# Patient Record
Sex: Female | Born: 2000
Health system: Southern US, Community
[De-identification: ages and names within clinical notes are randomized; demographics above are authoritative.]

## PROBLEM LIST (undated history)

## (undated) DIAGNOSIS — G2569 Other tics of organic origin: Secondary | ICD-10-CM

## (undated) DIAGNOSIS — H9190 Unspecified hearing loss, unspecified ear: Secondary | ICD-10-CM

## (undated) HISTORY — DX: Other tics of organic origin: G25.69

## (undated) HISTORY — PX: OSTEOCHONDROMA EXCISION: SHX2137

## (undated) HISTORY — DX: Unspecified hearing loss, unspecified ear: H91.90

---
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
## 2019-02-22 DIAGNOSIS — M25562 Pain in left knee: Secondary | ICD-10-CM | POA: Diagnosis not present

## 2019-02-24 ENCOUNTER — Other Ambulatory Visit: Payer: Self-pay | Admitting: Radiology

## 2019-02-24 ENCOUNTER — Ambulatory Visit
Admission: RE | Admit: 2019-02-24 | Discharge: 2019-02-24 | Disposition: A | Discharge status: Home / Self Care | Payer: BC Managed Care – PPO | Source: Ambulatory Visit | Attending: Radiology | Admitting: Radiology

## 2019-02-24 DIAGNOSIS — D242 Benign neoplasm of left breast: Secondary | ICD-10-CM

## 2019-02-28 DIAGNOSIS — M25562 Pain in left knee: Secondary | ICD-10-CM | POA: Diagnosis not present

## 2019-03-03 DIAGNOSIS — M25562 Pain in left knee: Secondary | ICD-10-CM | POA: Diagnosis not present

## 2019-03-17 DIAGNOSIS — M25562 Pain in left knee: Secondary | ICD-10-CM | POA: Diagnosis not present

## 2019-04-01 DIAGNOSIS — M25562 Pain in left knee: Secondary | ICD-10-CM | POA: Diagnosis not present

## 2019-04-07 DIAGNOSIS — M25562 Pain in left knee: Secondary | ICD-10-CM | POA: Diagnosis not present

## 2019-04-14 DIAGNOSIS — M25562 Pain in left knee: Secondary | ICD-10-CM | POA: Diagnosis not present

## 2019-08-30 ENCOUNTER — Other Ambulatory Visit: Payer: Self-pay

## 2019-08-30 ENCOUNTER — Ambulatory Visit
Admission: RE | Admit: 2019-08-30 | Discharge: 2019-08-30 | Disposition: A | Discharge status: Home / Self Care | Payer: BC Managed Care – PPO | Source: Ambulatory Visit | Attending: Radiology | Admitting: Radiology

## 2019-08-30 DIAGNOSIS — D242 Benign neoplasm of left breast: Secondary | ICD-10-CM

## 2019-08-30 DIAGNOSIS — N6324 Unspecified lump in the left breast, lower inner quadrant: Secondary | ICD-10-CM | POA: Diagnosis not present

## 2020-01-07 ENCOUNTER — Ambulatory Visit: Payer: BC Managed Care – PPO | Attending: Internal Medicine

## 2020-01-07 DIAGNOSIS — Z23 Encounter for immunization: Secondary | ICD-10-CM

## 2020-01-07 NOTE — Progress Notes (Signed)
   Covid-19 Vaccination Clinic  Name:  Shakeila Marchitto    MRN: [id number] DOB: 27-Aug-2001  01/07/2020  Ms. Cardo was observed post Covid-19 immunization for 15 minutes without incident. She was provided with Vaccine Information Sheet and instruction to access the V-Safe system.   Ms. Ylitalo was instructed to call 911 with any severe reactions post vaccine: Marland Kitchen Difficulty breathing  . Swelling of face and throat  . A fast heartbeat  . A bad rash all over body  . Dizziness and weakness   Immunizations Administered    Name Date Dose VIS Date Route   Pfizer COVID-19 Vaccine 01/07/2020  9:26 AM 0.3 mL 10/26/2018 Intramuscular   Manufacturer: [place]   Lot: [id number]   [place]: [id number]

## 2020-01-31 ENCOUNTER — Ambulatory Visit: Payer: BC Managed Care – PPO | Attending: Internal Medicine

## 2020-01-31 DIAGNOSIS — Z23 Encounter for immunization: Secondary | ICD-10-CM

## 2020-01-31 NOTE — Progress Notes (Signed)
   Covid-19 Vaccination Clinic  Name:  Eileen Rivera    MRN: [id number] DOB: Nov 10, 2000  01/31/2020  [name] was observed post Covid-19 immunization for 15 minutes without incident. She was provided with Vaccine Information Sheet and instruction to access the V-Safe system.   [name] was instructed to call 911 with any severe reactions post vaccine: Marland Kitchen Difficulty breathing  . Swelling of face and throat  . A fast heartbeat  . A bad rash all over body  . Dizziness and weakness   Immunizations Administered    Name Date Dose VIS Date Route   Pfizer COVID-19 Vaccine 01/31/2020  4:51 PM 0.3 mL 10/26/2018 Intramuscular   Manufacturer: [employer], [employer]   Lot: [id number]   [place]: [id number]

## 2020-02-09 IMAGING — US ULTRASOUND LEFT BREAST LIMITED
1 series · 5 of 5 positions shown · non-contrast
Comparison: None.

CLINICAL DATA: Palpable left breast mass felt by the patient for
approximately 1 week.

EXAM:
ULTRASOUND OF THE LEFT BREAST

[Series 1: ultrasound left breast limited · 0.06mm/px · 5 of 5 slices shown]
[im 1/5]
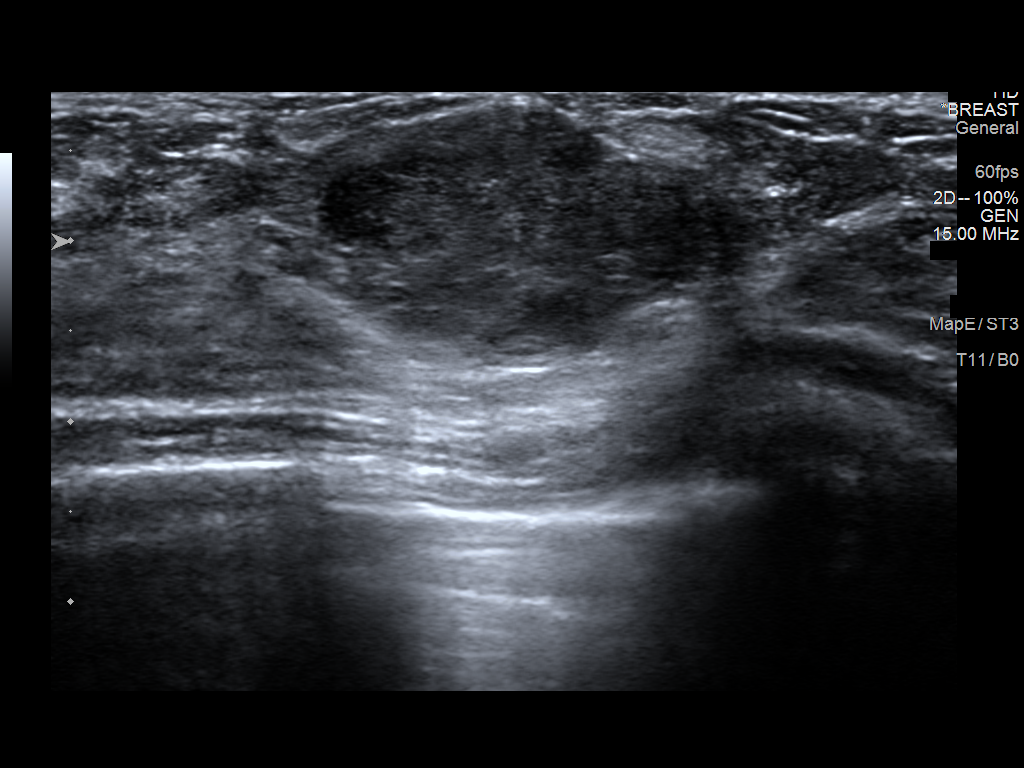
[im 2/5]
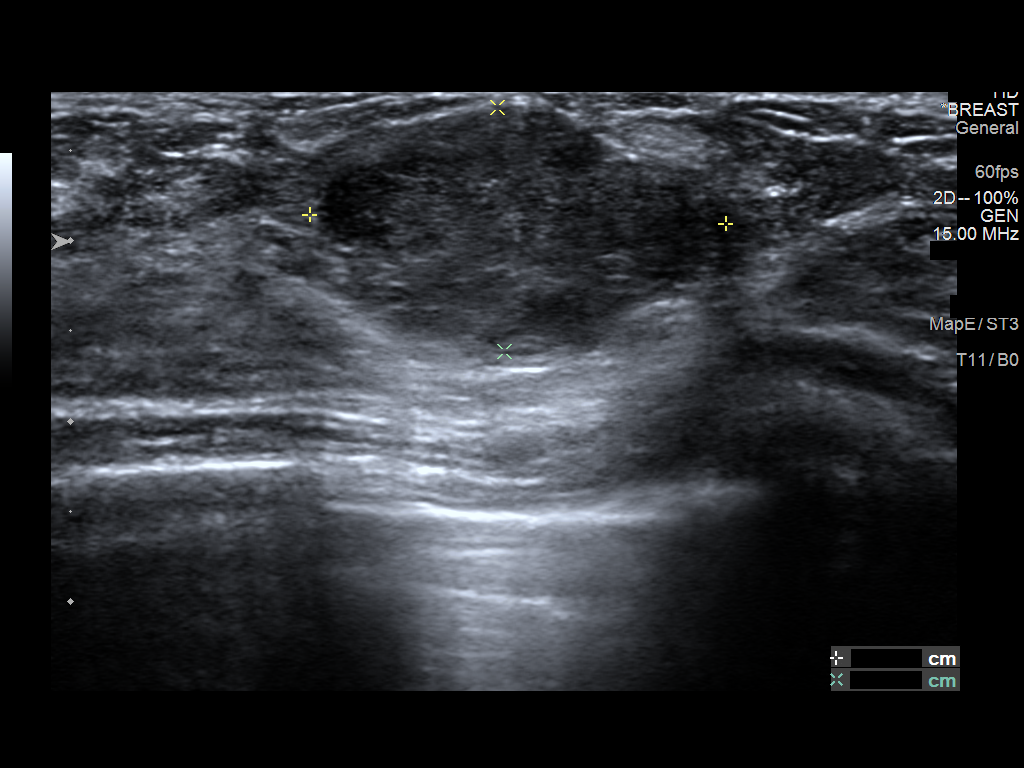
[im 3/5]
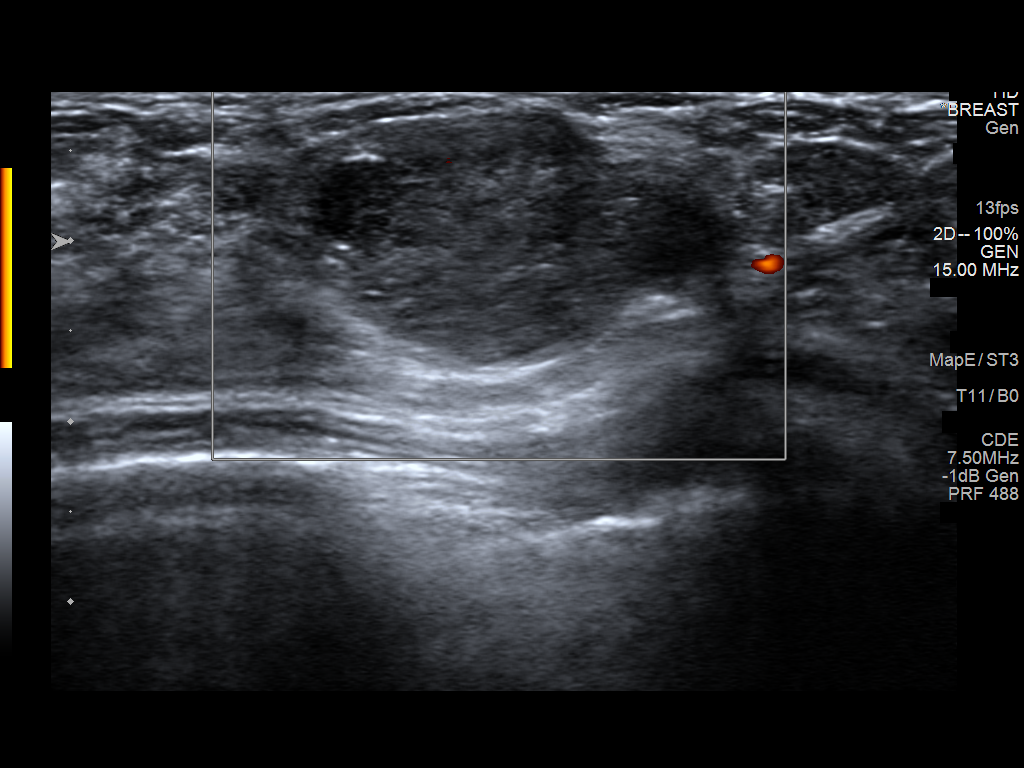
[im 4/5]
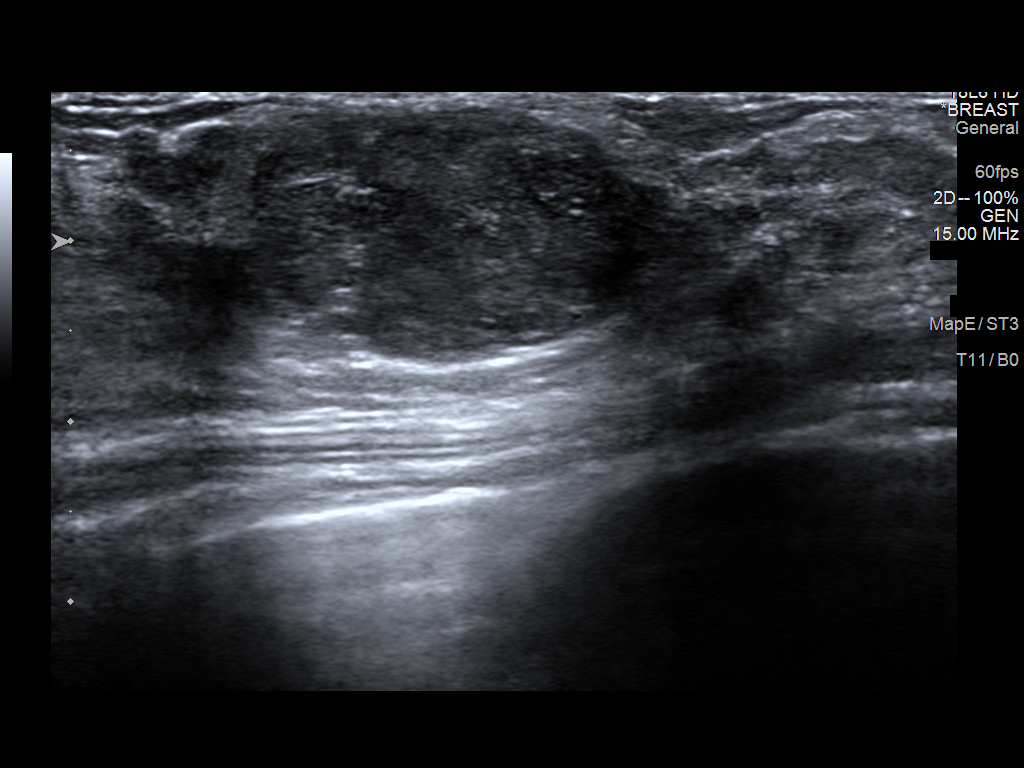
[im 5/5]
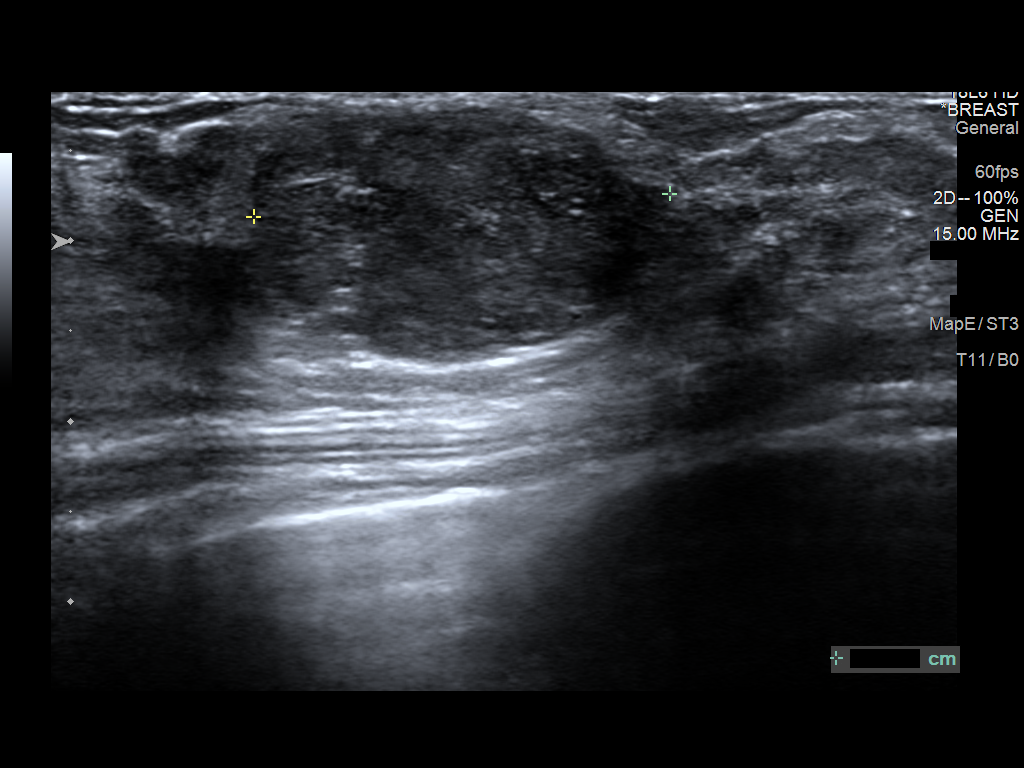

[5 of 5 positions shown; findings below may reference images not displayed]

FINDINGS: On physical exam, there is a moderately firm freely mobile
circumscribed mass in the left 7 o'clock breast.

Targeted ultrasound is performed, showing left breast 7 o'clock 6 cm
from the nipple hypoechoic circumscribed horizontally oriented solid
mass which measures 2.3 x 1.4 x 2.3 cm. No significantly increased
internal vascularity. The imaging appearance is suggestive of a
fibroadenoma.
IMPRESSION: Left breast 7 o'clock palpable mass with imaging appearance
suggestive of a fibroadenoma.

RECOMMENDATION:
The options of surgical excision versus imaging follow-up were
discussed with the patient and her mother. The patient opted for an
imaging follow-up. Therefore, focused left breast ultrasound in 6
months is recommended.

I have discussed the findings and recommendations with the patient.
Results were also provided in writing at the conclusion of the
visit. If applicable, a reminder letter will be sent to the patient
regarding the next appointment.

BI-RADS CATEGORY  3: Probably benign.

## 2020-04-02 ENCOUNTER — Telehealth (INDEPENDENT_AMBULATORY_CARE_PROVIDER_SITE_OTHER): Payer: Self-pay | Admitting: Pediatrics

## 2020-04-02 NOTE — Telephone Encounter (Signed)
Mother returned my call.  There is experiencing significant weakness and inability to move her left leg which had a tumor removed from it sometime ago.  She has been seen by orthopedics and rheumatology with negative studies.  Laboratory was sent to me for my review in preparation for tomorrow's visit.  There is elevated titers for ANA but no other sign of inflammation.  I think that this is nonspecific.

## 2020-04-03 ENCOUNTER — Encounter (INDEPENDENT_AMBULATORY_CARE_PROVIDER_SITE_OTHER): Payer: Self-pay | Admitting: Pediatrics

## 2020-04-03 ENCOUNTER — Ambulatory Visit (INDEPENDENT_AMBULATORY_CARE_PROVIDER_SITE_OTHER): Payer: BC Managed Care – PPO | Admitting: Pediatrics

## 2020-04-03 ENCOUNTER — Other Ambulatory Visit: Payer: Self-pay

## 2020-04-03 DIAGNOSIS — R29898 Other symptoms and signs involving the musculoskeletal system: Secondary | ICD-10-CM | POA: Diagnosis not present

## 2020-04-03 DIAGNOSIS — R200 Anesthesia of skin: Secondary | ICD-10-CM | POA: Diagnosis not present

## 2020-04-03 NOTE — Progress Notes (Signed)
Patient: Eileen Rivera MRN: [id number] Sex: female DOB: 07-Feb-2001  Provider: Wyline Copas, MD Location of Care: [facility]  Note type: Routine return visit  History of Present Illness: Referral Source: Eileen Grana, MD History from: mother, patient and [facility] chart Chief Complaint: Follow up  Eileen Rivera is a 19 y.o. female who returns April 03, 2020 for the first time since October 20, 2017.  She has tics of organic origin, attention deficit disorder inattentive type, central auditory processing disorder, and dysarthria.  She has been a student [employer] college but is transferring to [employer].  Unfortunately, she has developed pain in her left popliteal fossa near where she had surgery to remove an osteochondroma of the left femur February 14, 2016.  Review of the medical record shows that she has been seen by Dr. Vickki Rivera of [facility] for pain in her knee since March 2019.  Recently she is complained of pain in her low back.  Beginning Thursday, July 15 she developed soreness on either sides of her calf on the left and numbness that involves altered sensation up to her inguinal ligament on the left and weakness in her leg with minimal sparing of the hip flexor and knee flexor.  She is not otherwise moving her leg.  She is able to bear weight on it when she is on crutches.  It clearly does not give way.  She does not show a steppage gait or foot drop on crutches.  She has not fallen nor she had any injury.  The structures around the knee are not swollen nor are they erythematous.  She does not have sensory loss in her trunk.  She has diminished reflexes in both legs no response to left plantar stimulation with a flexor plantar on the right, normal reflexes in her arms as well as normal strength and sensation.  By history she has Raynaud's phenomenon in her left foot and ankle with the foot becoming blue when it is cold.  She had extensive laboratory work  drawn in April due to persistent pain in her knee looking for collagen vascular disease which showed an elevated homogeneous ANA but no other signs of inflammation.  MRI scan on July 20 showed patchy marrow edema along both sides of the weightbearing lateral tibiofemoral joint without evidence of fracture, and sessile osteochondromas arising from the posterior lateral distal femoral diaphysis which is unchanged.  The patchy edema was suggestive of a stress reaction but no fracture was present.  No other structural abnormalities were seen.  Mother was told that this is a small popliteal cyst.  I do not know why Eileen Rivera would be experiencing pain from a situation that is considered static except for the "stress reaction".  MRI of the lumbosacral spine showed normal disc spaces.  No mention was made of the spinal cord.  She is getting ready to start school a week from now at [employer]  At present, I do not know how this is going to be possible.  She is going to have to get around campus.  I think that is going to be extremely difficult.  Review of Systems: A complete review of systems was remarkable for patient is here to be seen for a follow up. Mom reports that the patient has been having issues with her left leg.She states that the patient is unable to bend her leg or flex her foot. She also states that she is unable to wiggle her toes.  Mom states that the patient has been diagnosed with Raynaud's Phenomenon. She reports that it causes here foot to turn blue when it is cold. She is concerned about the patient going to school with the neurological problems. No other concerns at this time., all other systems reviewed and negative.  Past Medical History Diagnosis Date  . Rivera loss   . Tics of organic origin    Hospitalizations: No., Head Injury: No., Nervous System Infections: No., Immunizations up to date: Yes.    Copied from prior chart notes A diagnosis of PDD NOS was made in August 2007 by  Eileen Rivera. By 1st grade however she was doing well in school and tantrums had subsided.  She has problems with articulation which have persisted despite speech therapy.  Psychological evaluation in the spring of 2011 showed a diagnosis of ODD, possible CAPD, Full scale IQ of 101, Verbal Comprehension index: 95, Perceptual Reasoning index.: 104,Working Memory index: 113, and Processing Speed index: 91. The treatment testing on the Woodcock-Johnson III failed to show evidence of significant learning differences. This series raiding scale suggested concerns in maintaining attention, depression and anxiety, social difficulties, bowel problems, and rule breaking behaviors at home. The processing speed was thought to significantly affect her ability to efficiently perform work in school.  Birth History 7 lbs. 10 oz infant born at full term to a 65 year old primigravida.  Mother had pneumonia 6 months into the pregnancy.  Labor lasted for over 17 hours and was induced. Mother had epidural x2.  Normal spontaneous vaginal delivery.  Nursery course was uneventful. She went home with her parents. Growth and development was reportedly normal, although there were questions about Autistic Spectrum disorder when I saw her February 19, 2006. I concluded that that was not the case.  Behavior History none  Surgical History Procedure Laterality Date  . OSTEOCHONDROMA EXCISION    . OTHER SURGICAL HISTORY  09/2009   Patient had surgery to remove an infected salivary gland    Family History family history includes Cancer in her paternal grandfather. Family history is negative for migraines, seizures, intellectual disabilities, blindness, deafness, birth defects, chromosomal disorder, or autism.  Social History Socioeconomic History  . Marital status: Single  . Years of education:  64  . Highest education level:  Rising college junior  Occupational History  . Not employed  Tobacco Use  .  Smoking status: Never Smoker  . Smokeless tobacco: Never Used  Substance and Sexual Activity  . Alcohol use: No    Alcohol/week: 0.0 standard drinks  . Drug use: No  . 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    She attended [employer].    She lives with both parents and has no siblings.     She enjoys eating, dancing, and singing    She is a Furniture conservator/restorer at [employer].   Allergies Allergen Reactions  . No Known Allergies    Physical Exam BP 90/68   Pulse 72   Ht 5' 3.5" (1.613 m)   Wt 105 lb 9.6 oz (47.9 kg)   BMI 18.41 kg/m   General: alert, well developed, well nourished, in no acute distress, sandy hair, blue eyes, right handed Head: normocephalic, no dysmorphic features Ears, Nose and Throat: Otoscopic: tympanic membranes normal; pharynx: oropharynx is pink without exudates or tonsillar hypertrophy Neck: supple, full range of motion, no cranial or cervical bruits Respiratory: auscultation clear Cardiovascular: no murmurs, pulses are  normal Musculoskeletal: no skeletal deformities or apparent scoliosis; exquisite pain in her left popliteal fossa underlying a keloid scar from prior surgery, no masses palpated Skin: no rashes or neurocutaneous lesions  Neurologic Exam  Mental Status: alert; oriented to person, place and year; knowledge is normal for age; language is normal Cranial Nerves: visual fields are full to double simultaneous stimuli; extraocular movements are full and conjugate; pupils are round reactive to light; funduscopic examination shows sharp disc margins with normal vessels; symmetric facial strength; midline tongue and uvula; air conduction is greater than bone conduction bilaterally Motor: Normal strength, tone and mass; good fine motor movements; no pronator drift; the exception is in her left leg.  She can keep her knee in the air with her foot on the table suggesting some strength in her hip  and knee flexors; she is unable to volitionally move her left leg lying on the bed Sensory: intact responses to cold, vibration, proprioception and stereognosis Coordination: good finger-to-nose, rapid repetitive alternating movements and finger apposition Gait and Station: she is able to get off the table and bear weight on her left leg while simultaneously placing crutches under her arms; she can slightly move her left leg forward and backward at the hip and subconsciously with her left leg off the floor when walking; she does not have the appearance of a foot drop Reflexes: symmetric and normal in the upper extremities absent in the lower extremities bilaterally; no clonus; right flexor, left neutral plantar responses  Assessment 1.  Weakness of left leg, R29.898. 2.  Numbness in left leg, R20.0. 3.  Popliteal fossa pain  Discussion I do not know why this has occurred.  Not able to find an etiology that helps me understand her weakness and numbness.  The numbness is in an nonphysiologic distribution.  She shows strength in some muscles when she is walking that she does not display in formal testing when she is lying on the table.  I do not know the role that pain plays and limiting her movement.  Plan I asked her mother to procure the MRI scan for my review.  We will need to perform nerve conductions and EMGs to see if there is any abnormality that would explain her weakness and numbness.  It has been over 2 weeks since she had her symptoms.  I think that we are going to have to delay her return to school until the cause is determined and/or her symptoms resolve.  I do not think this represents a myelopathy present in her cervical or thoracic spine nor do I think it represents an abnormality in her brain.  The distribution of her symptoms does not fit a pattern that I recognize or can localize.   Medication List   Accurate as of April 03, 2020 12:13 PM. If you have any questions, ask your nurse  or doctor.    ondansetron 4 MG disintegrating tablet Commonly known as: Zofran ODT Take 1 tablet (4 mg total) by mouth every 8 (eight) hours as needed for nausea or vomiting.    The medication list was reviewed and reconciled. All changes or newly prescribed medications were explained.  A complete medication list was provided to the patient/caregiver.  Jodi Geralds MD

## 2020-04-03 NOTE — Patient Instructions (Addendum)
I am not certain why Eileen Rivera is having weakness and numbness in her left leg.  As I mentioned to you it does not fit any pattern of weakness and numbness that I have seen in other people with medical conditions.  I do not think the weakness solely comes from the pain that she experiences behind her left knee which is called the popliteal fossa.  Obviously this is the area where she had surgery and has a big scar.  As best I can determine the MRI scan of her knee did not reveal any structural abnormality other than some inflammation in ligaments.  She does not have a numbness in her trunk, loss of bowel and bladder control or weakness in her arms or the right leg.  This makes a abnormality in this neck or trunk seem very unlikely.  The only location that I could think of that could possibly relate to her problem would be in her left pelvis, and she is not having pelvic pain.  I wonder review the MRI scans of the knee and back.  At some point we will need to get nerve conductions and EMGs through [facility].  I think that I can arrange that.  I am not going to order that right now until I see the imaging.  I think she would benefit from physical therapy and I would like to have the [facility] therapists involved in her care.  I want to see her back in 4 weeks time.  I will get back with you soon as I have reviewed the MRI studies.

## 2020-04-04 ENCOUNTER — Telehealth (INDEPENDENT_AMBULATORY_CARE_PROVIDER_SITE_OTHER): Payer: Self-pay | Admitting: Pediatrics

## 2020-04-04 DIAGNOSIS — R29898 Other symptoms and signs involving the musculoskeletal system: Secondary | ICD-10-CM

## 2020-04-04 DIAGNOSIS — R200 Anesthesia of skin: Secondary | ICD-10-CM

## 2020-04-04 NOTE — Telephone Encounter (Signed)
  Who's calling (name and relationship to patient) : [name] ( mom)  Best contact number:[phone]  Provider they see: Dr. Gaynell Face  Reason for call: Patient had an appt yesterday and came back to drop off CDs of the patients MRI. Mom just calling to check and see if the Dr. Has had time to look at it yet. Patient is moving into the dorms next week and mom and patient are concerned if she will be able to.     PRESCRIPTION REFILL ONLY  Name of prescription:  Pharmacy:

## 2020-04-04 NOTE — Telephone Encounter (Signed)
I spoke with mom for about 15 minutes.  I reviewed the MRI studies and though I see edema within the bone, I do not see any other significant structural abnormalities that should be contributing to her pain or the numbness and weakness in her leg.  I am unable to localize the source of her neurologic disorder.  I am also unable to determine the etiology of it at this time.  The issue at hand is whether or not to allow her to go to school and receive therapy in [place].  I think there are some dangers of falling and just the difficulty getting from one place to another on campus.  If she decided to stay in [place] I would get her started on physical therapy and would consider nerve conductions and EMGs although I believe that that is a low yield study.  Would not recommend imaging unless there was some worsening of her symptoms that allowed me to localize the source of them.  Mother will talk with the patient and get back with me.  I would be prepared to write letter to the school explaining that she has had a sudden change in her medical condition that makes it difficult for her to attend school on campus at this time.  The other alternative would be virtual school until her situation is clarified and would then allow [place] to continue virtual school if she is not getting stronger, or return to campus if she is.

## 2020-04-05 ENCOUNTER — Other Ambulatory Visit (INDEPENDENT_AMBULATORY_CARE_PROVIDER_SITE_OTHER): Payer: Self-pay

## 2020-04-05 DIAGNOSIS — R29898 Other symptoms and signs involving the musculoskeletal system: Secondary | ICD-10-CM

## 2020-04-05 DIAGNOSIS — R200 Anesthesia of skin: Secondary | ICD-10-CM

## 2020-04-05 NOTE — Telephone Encounter (Signed)
Referrals have been sent to [facility] and [facility]

## 2020-04-05 NOTE — Telephone Encounter (Signed)
I spoke with mother.  We are going to order nerve conductions and EMGs at [facility].  If she is not improving, I may request a second opinion with one of the neurologist there because I cannot localize this and some of the findings seem nonphysiologic.

## 2020-04-05 NOTE — Telephone Encounter (Signed)
Who's calling (name and relationship to patient) : Eileen Rivera mom  Best contact number: [phone]  Provider they see: Dr. Gaynell Face  Reason for call: Mom says that family wants to move forward with additional testing.   Mom wants the PT to be ordered and mom wants to confirm if this will be at [facility] or at a different PT office?   School is requesting documentation for accomodation approval.  Mom invites Dr. Gaynell Face to call with any info. Or questions  Call ID:      PRESCRIPTION REFILL ONLY  Name of prescription:  Pharmacy:

## 2020-04-05 NOTE — Addendum Note (Signed)
Addended by: Jodi Geralds on: 04/05/2020 03:36 PM   Modules accepted: Orders

## 2020-04-06 ENCOUNTER — Telehealth (INDEPENDENT_AMBULATORY_CARE_PROVIDER_SITE_OTHER): Payer: Self-pay | Admitting: Pediatrics

## 2020-04-06 ENCOUNTER — Encounter (INDEPENDENT_AMBULATORY_CARE_PROVIDER_SITE_OTHER): Payer: Self-pay | Admitting: Pediatrics

## 2020-04-06 NOTE — Telephone Encounter (Signed)
Spoke with mom to inform her that the letter she requested was ready. She stated that she would like to have the letter emailed to her. The letter has been emailed, as well as office notes sent.

## 2020-04-17 ENCOUNTER — Other Ambulatory Visit: Payer: Self-pay

## 2020-04-17 ENCOUNTER — Encounter: Payer: Self-pay | Admitting: Neurology

## 2020-04-17 ENCOUNTER — Ambulatory Visit (INDEPENDENT_AMBULATORY_CARE_PROVIDER_SITE_OTHER): Payer: BC Managed Care – PPO | Admitting: Neurology

## 2020-04-17 ENCOUNTER — Ambulatory Visit: Payer: BC Managed Care – PPO | Admitting: Neurology

## 2020-04-17 ENCOUNTER — Telehealth (INDEPENDENT_AMBULATORY_CARE_PROVIDER_SITE_OTHER): Payer: Self-pay | Admitting: Pediatrics

## 2020-04-17 DIAGNOSIS — R29898 Other symptoms and signs involving the musculoskeletal system: Secondary | ICD-10-CM

## 2020-04-17 NOTE — Progress Notes (Signed)
Please refer to EMG and nerve conduction procedure note.  

## 2020-04-17 NOTE — Telephone Encounter (Signed)
I reviewed the study with Eileen Rivera and her mother.  At present there is nothing more to do except for the physical therapy on the 23rd.  I will see her on the 27th.  She has not improved but has not deteriorated and there are no new findings.

## 2020-04-17 NOTE — Progress Notes (Signed)
[facility]    Nerve / Sites Muscle Latency Ref. Amplitude Ref. Rel Amp Segments Distance Area    ms ms mV mV %  cm mVms  L Peroneal - EDB     Ankle EDB 6.4 ?6.5 0.9 ?2.0 100 Ankle - EDB 9 4.2  L Tibial - AH     Ankle AH 5.0 ?5.8 17.1 ?4.0 100 Ankle - AH 9 46.2         SNC    Nerve / Sites Rec. Site Peak Lat Ref.  Amp Ref. Segments Distance    ms ms V V  cm  L Sural - Ankle (Calf)     Calf Ankle 3.4 ?4.4 16 ?6 Calf - Ankle 14  L Superficial peroneal - Ankle     Lat leg Ankle 4.4 ?4.4 7 ?6 Lat leg - Ankle 14         F  Wave    Nerve F Lat Ref.   ms ms  L Tibial - AH 48.0 ?56.0

## 2020-04-17 NOTE — Procedures (Signed)
     HISTORY:  Eileen Rivera is a 19 year old patient with a history of total paralysis of the left leg that came on in mid July 2021.  MRI of the lumbar spine was relatively unremarkable.  The patient is being sent over for evaluation of the left leg weakness.   NERVE CONDUCTION STUDIES:  Nerve conduction studies were performed on the left lower extremity.  The left peroneal and posterior tibial nerves revealed normal distal motor latencies with a low motor amplitude for the left peroneal nerve, the nerve conduction velocity for the left posterior tibial nerve was normal, the patient did not allow for stimulation of the peroneal nerve at the popliteal fossa or fibular head.  The left sural and posterior tibial sensory latencies were normal.  The F-wave latency of the left posterior tibial nerve was normal.   EMG STUDIES:  EMG study was performed on the left lower extremity:  The tibialis anterior muscle reveals no voluntary motor units. No fibrillations or positive waves were seen. The peroneus tertius muscle reveals 1 to 3K motor units with decreased voluntary effort. No fibrillations or positive waves were seen. The medial gastrocnemius muscle reveals 1 to 3K motor units with decreased voluntary effort. No fibrillations or positive waves were seen. The vastus lateralis muscle reveals 2 to 4K motor units with poor voluntary effort. No fibrillations or positive waves were seen. The iliopsoas muscle reveals 2 to 4K motor units with full recruitment. No fibrillations or positive waves were seen. The biceps femoris muscle (long head) reveals 2 to 3K motor units with poor voluntary effort. No fibrillations or positive waves were seen. The lumbosacral paraspinal muscles were tested at 3 levels, and revealed no abnormalities of insertional activity at all 3 levels tested. There was good relaxation.   IMPRESSION:  Nerve conduction studies of the left lower extremity were relatively unremarkable  exception of a low distal motor amplitude for the left peroneal nerve, the proximal aspect of this nerve was not tested as patient refused.  EMG evaluation of the left lower extremity shows no abnormalities although the patient would not voluntarily contract many of the muscles tested.  There is no evidence of any neuromuscular disease identified by this study.  Jill Alexanders MD 04/17/2020 4:32 PM  [facility] [address] [place], [place] [address]  Phone [phone] Fax [phone]

## 2020-04-20 ENCOUNTER — Telehealth (INDEPENDENT_AMBULATORY_CARE_PROVIDER_SITE_OTHER): Payer: Self-pay | Admitting: Pediatrics

## 2020-04-20 NOTE — Telephone Encounter (Signed)
20-minute discussion with mother.  I explained the nerve conductions and EMGs and their relationship to her overall condition.  In this setting, I have seen virtually all the patients who had presented with localized weakness and no radiographic or electrodiagnostic cause for it improve over time with physical therapy.  I am hoping that she will fit this pattern.  I do not think we are ever going to know why the weakness occurred but I do not believe that it is going to be a long-term condition.

## 2020-04-20 NOTE — Telephone Encounter (Signed)
Who's calling (name and relationship to patient) : Eileen Rivera mom   Best contact number: [phone]  Provider they see: Dr. Gaynell Face  Reason for call: [name] would like to speak with Dr. Gaynell Face about the phone call that Dr. Gaynell Face has placed to the family from Thursday evening. They have additional questions about the topic of that last phone call.   Call ID:      PRESCRIPTION REFILL ONLY  Name of prescription:  Pharmacy:

## 2020-04-27 ENCOUNTER — Other Ambulatory Visit: Payer: Self-pay

## 2020-04-27 ENCOUNTER — Encounter (INDEPENDENT_AMBULATORY_CARE_PROVIDER_SITE_OTHER): Payer: Self-pay | Admitting: Pediatrics

## 2020-04-27 ENCOUNTER — Ambulatory Visit (INDEPENDENT_AMBULATORY_CARE_PROVIDER_SITE_OTHER): Payer: BC Managed Care – PPO | Admitting: Pediatrics

## 2020-04-27 VITALS — BP 110/78 | HR 68 | Ht 63.5 in | Wt 106.0 lb

## 2020-04-27 DIAGNOSIS — R200 Anesthesia of skin: Secondary | ICD-10-CM

## 2020-04-27 DIAGNOSIS — R29898 Other symptoms and signs involving the musculoskeletal system: Secondary | ICD-10-CM | POA: Diagnosis not present

## 2020-04-27 NOTE — Patient Instructions (Signed)
Thank you for coming today.  I think that the sensation in your leg has improved since I saw you last.  Unfortunately motor strength is not.  I am very excited about physical therapy.  I had like to see you after a month of therapy in early October.  I am optimistic that you are going to regain the strength in your left leg.  In part that is because of the nerve conductions and EMGs that did not show any damage to nerves.  In part that is because he seen some improvement in your sensory function.  Sensory and motor nerve traveled together in the periphery and that gives me optimism that we will begin to see some improved strength.

## 2020-04-27 NOTE — Progress Notes (Signed)
Patient: Eileen Rivera MRN: [id number] Sex: female DOB: 05/01/2001  Provider: Wyline Copas, MD Location of Care: [facility]  Note type: Routine return visit  History of Present Illness: Referral Source:  History from: mother, patient and [facility] chart Chief Complaint: Weakness and numbness of the left leg  Eileen Rivera is a 19 y.o. female who returns April 27, 2020 for the first time since April 03, 2020.  I had followed her for tics of organic origin, attention deficit disorder, inattentive type, central auditory processing disorder, and dysarthria.  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  On her last visit she developed pain in her left popliteal fossa near where she had surgery to remove an osteochondroma of the left femur February 14, 2016.  This pain has waxed and waned since March 2019 and has been followed by Eileen Rivera at [facility].  She also complained of pain in her low back.  See my previous note to describe the evolution of her symptoms but in short she was unable to bear weight on her left leg except when she was on crutches, she had no sensory loss in her trunk but her sensory loss began at the left inguinal ligament she had some changes suggesting Raynaud's phenomenon in the left foot and ankle.  She had laboratory studies and neuroimaging which failed to show a structural or immunologic issue that could explain her symptoms.  It was my opinion that her symptoms are no-nphysiologic.  The only other study that I thought would possibly provide insight to an underlying organic disorder was nerve conductions and EMGs which were performed by Dr. Floyde Rivera April 17, 2020.  Full study was not possible because she did not tolerate the pain associated with the entire procedure but another study was done that it was clear that she had no evidence of a conduction abnormality in the nerves of her legs  and no signs of denervation.  She has been evaluated by physical therapy.  This is scheduled to start next week and continue twice a week throughout September.  She will work with the therapists at [facility].  Her general health is good.  She is sleeping well.  Her weight is stable.  Her mother says that she has made no progress since last visit.  Intermittently she can move her toes and her ankle but it is not a persistent behavior.  She complained that her knee was painful.  It is not swollen or red.  She has full range of motion.  She describes the pain as pressure I think the pain is under her patella.  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  Review of Systems: A complete review of systems was remarkable for patient is here to be seen for weakness in left leg. Patient reports that she has not made any progress since her last visit. She states that there are some days she can wiggle her toes but it does not last long. She states that she is in pain today. She reports that she has physical therapy starting Monday. She has no other concerns at this time., all other systems reviewed and negative.  Past Medical History Diagnosis Date  . Rivera loss   . Tics of organic origin    Hospitalizations: No.,  Head Injury: No., Nervous System Infections: No., Immunizations up to date: Yes.    Copied from prior chart notes MRI scan on July 20 showed patchy marrow edema along both sides of the weightbearing lateral tibiofemoral joint without evidence of fracture, and sessile osteochondromas arising from the posterior lateral distal femoral diaphysis which is unchanged.  The patchy edema was suggestive of a stress reaction but no fracture was present.  No other structural abnormalities were seen.  Mother was told that this is a small popliteal cyst.  I do not know why Eileen Rivera  would be experiencing pain from a situation that is considered static except for the "stress reaction".  MRI of the lumbosacral spine showed normal disc spaces.  No mention was made of the spinal cord.  A diagnosis of PDD NOS was made in August 2007 by Dr. Lyda Perone. By 1st grade however she was doing well in school and tantrums had subsided.  She has problems with articulation which have persisted despite speech therapy.  Psychological evaluation in the spring of 2011 showed a diagnosis of ODD, possible CAPD, Full scale IQ of 101, Verbal Comprehension index: 95, Perceptual Reasoning index.: 104,Working Memory index: 113, and Processing Speed index: 91. The treatment testing on the Woodcock-Johnson III failed to show evidence of significant learning differences. This series raiding scale suggested concerns in maintaining attention, depression and anxiety, social difficulties, bowel problems, and rule breaking behaviors at home. The processing speed was thought to significantly affect her ability to efficiently perform work in school.  Birth History 7 lbs. 10 oz infant born at full term to a 57 year old primigravida.  Mother had pneumonia 6 months into the pregnancy.  Labor lasted for over 17 hours and was induced. Mother had epidural x2.  Normal spontaneous vaginal delivery.  Nursery course was uneventful. She went home with her parents. Growth and development was reportedly normal, although there were questions about Autistic Spectrum disorder when I saw her February 19, 2006. I concluded that that was not the case.  Behavior History none  Surgical History Procedure Laterality Date  . OSTEOCHONDROMA EXCISION    . OTHER SURGICAL HISTORY  09/2009   Patient had surgery to remove an infected salivary gland    Family History family history includes Cancer in her paternal grandfather. Family history is negative for migraines, seizures, intellectual disabilities, blindness, deafness,  birth defects, chromosomal disorder, or autism.  Social History Socioeconomic History  . Marital status: Single  . Years of education:  21  . Highest education level:  Junior in college  Occupational History  . Not employed  Tobacco Use  . Smoking status: Never Smoker  . Smokeless tobacco: Never Used  Substance and Sexual Activity  . Alcohol use: No    Alcohol/week: 0.0 standard drinks  . Drug use: No  . Sexual activity: Never  Social History Narrative    Hena a high school graduate.    She attended [employer].    She lives with both parents and has no siblings.     She enjoys eating, dancing, and singing    She is a Furniture conservator/restorer at [employer].   Allergies Allergen Reactions  . No Known Allergies    Physical Exam BP 110/78   Pulse 68   Ht 5' 3.5" (1.613 m)   Wt 106 lb (48.1 kg)   BMI 18.48 kg/m   General: alert, well developed, well nourished, in no acute distress, sandy hair hair, blue eyes, right handed Head: normocephalic, no  dysmorphic features Ears, Nose and Throat: Otoscopic: tympanic membranes normal; pharynx: oropharynx is pink without exudates or tonsillar hypertrophy Neck: supple, full range of motion, no cranial or cervical bruits Respiratory: auscultation clear Cardiovascular: no murmurs, pulses are normal Musculoskeletal: no skeletal deformities or apparent scoliosis; no atrophy Skin: no rashes or neurocutaneous lesions  Neurologic Exam  Mental Status: alert; oriented to person, place and year; knowledge is normal for age; language is normal Cranial Nerves: visual fields are full to double simultaneous stimuli; extraocular movements are full and conjugate; pupils are round reactive to light; funduscopic examination shows sharp disc margins with normal vessels; symmetric facial strength; midline tongue and uvula; air conduction is greater than bone conduction bilaterally Motor: Normal strength in both arms and the right leg, tone and  mass; good fine motor movements; no pronator drift; patient shows no volitional movement of any muscles in the leg Sensory: intact responses to cold, vibration, proprioception and stereognosis; numbness to cold is present from the knee distally in a circumferential fashion; there is no loss of vibration or position sense Coordination: good finger-to-nose, rapid repetitive alternating movements and finger apposition Gait and Station: On crutches she bears weight on her left foot she is able to flex her leg at the hip and her knee to move her leg forward.  She is not dragging the leg or circumducting it.  She is not able to do this when she is not on crutches. Reflexes: symmetric and absent bilaterally; no clonus; bilateral flexor plantar responses  Assessment 1.  Weakness of the left leg, R29.898. 2.  Numbness in the left leg, R20.0.  Discussion Concerned that displayed no ability to extend or flex the proximal or portions of her left leg, or sensory abnormality now is circumferential from the knee down to light touch.  She has no abnormalities in vibration or proprioception.  When I had her leave the office walking on crutches, she shows no foot drop in the left foot.  She is able to slide the foot through with the toes upwards.  She bears weight on the left leg she lives her leg at the left hip and is able to flex and extend her knee while walking on crutches.  In short she does not appear to be limping on the leg nor is she moving it in a substantially different way from the right.  Plan I am optimistic that this trend is going to continue.  I think that the physical therapist will help her walk independently without crutches, but it may take some time.  I am pleased that she is taking some classes at school but they have allowed her to remain at home this semester.  I am hopeful that she will be able to return to school in the winter/spring semester.  I asked her to return to see me in a month  after she has had a month of physical therapy.  It is my hope that we will see palpable improvement as has occurred in the last 3 weeks even though she and her mother were not aware of it.  Greater than 50% of a 25-minute visit was spent counseling and coordination of care concerning the numbness and weakness of her leg and discussing the path forward.   Medication List   Accurate as of April 27, 2020  2:52 PM. If you have any questions, ask your nurse or doctor.    ondansetron 4 MG disintegrating tablet Commonly known as: Zofran ODT Take 1 tablet (4  mg total) by mouth every 8 (eight) hours as needed for nausea or vomiting.    The medication list was reviewed and reconciled. All changes or newly prescribed medications were explained.  A complete medication list was provided to the patient/caregiver.  Jodi Geralds MD

## 2020-04-30 ENCOUNTER — Ambulatory Visit (INDEPENDENT_AMBULATORY_CARE_PROVIDER_SITE_OTHER): Payer: BC Managed Care – PPO | Admitting: Pediatrics

## 2020-06-05 ENCOUNTER — Other Ambulatory Visit: Payer: Self-pay

## 2020-06-05 ENCOUNTER — Encounter (INDEPENDENT_AMBULATORY_CARE_PROVIDER_SITE_OTHER): Payer: Self-pay | Admitting: Pediatrics

## 2020-06-05 ENCOUNTER — Ambulatory Visit (INDEPENDENT_AMBULATORY_CARE_PROVIDER_SITE_OTHER): Payer: BC Managed Care – PPO | Admitting: Pediatrics

## 2020-06-05 VITALS — BP 108/80 | HR 76 | Ht 63.5 in | Wt 107.4 lb

## 2020-06-05 DIAGNOSIS — R29898 Other symptoms and signs involving the musculoskeletal system: Secondary | ICD-10-CM

## 2020-06-05 NOTE — Patient Instructions (Signed)
Thanks for coming today I am glad that you made so much progress.  I hope that over the next month we will see even further progress.  I think trying to get back to school in January is a good idea.

## 2020-06-05 NOTE — Progress Notes (Signed)
Patient: Eileen Rivera MRN: [id number] Sex: female DOB: [birth year]  Provider: Wyline Copas, MD Location of Care: [facility]  Note type: Routine return visit  History of Present Illness: Referral Source: Aleda Grana, MD History from: mother, patient and [facility] chart Chief Complaint: Weakness in left leg  Eileen Rivera is a 19 y.o. female who returns June 05, 2020 for the first time since April 27, 2020.  She has tics of organic origin, attention deficit disorder inattentive type, central auditory processing disorder, and dysarthria.  She transferred from [employer] where she attended last year to [employer] which she was supposed to begin this fall.  Her clinical course is documented in the last note.  She developed pain near where she had removal of an osteochondroma from her left femur which is waxed and waned since 2019.  Her symptoms involve that she was unable to bear weight on the left leg except on crutches and had nonphysiologic sensory loss getting in the left inguinal ligament extending distally and some changes in the color of her foot suggesting Raynaud's phenomenon in the left foot and ankle.  Laboratory studies and her imaging and electrophysiologic studies were negative.  I sent her to physical therapy at [facility] neuro rehabilitation and this worked quite well.  She has been transferred from one therapist to another and physical therapy has been recommended for another month 2 sessions a week.  She is now able to walk fairly fluidly.  Weight nicely on her left side with a crutch.  There is minimal weakness in the left leg and what is there seems to be mild giveaway strength.  Her sensation is normal.  Plan at this time is for her to return to the campus for the spring semester in January.  She is taking 1 online course where she works her own pace and another virtual course that meets 3 days a week.  This is going well.  Her health is good.   Her tics have not worsened.  He is getting adequate sleep.  She has not developed Covid.  I believe that she is vaccinated.  Review of Systems: A complete review of systems was remarkable for patient is here to be seen for weakness in left leg. Mom reports that the patient has been improving. She states that neuro rehab has really helped the patients. She reports that she is now taking physical therapy twice a week. She has no concerns at this time., all other systems reviewed and negative.  Past Medical History Diagnosis Date  . Hearing loss   . Tics of organic origin    Hospitalizations: No., Head Injury: No., Nervous System Infections: No., Immunizations up to date: Yes.    Copied from prior chart notes MRI scan on July 20 showedpatchy marrow edema along both sides of the weightbearing lateral tibiofemoral joint without evidence of fracture, and sessile osteochondromas arising from the posterior lateral distal femoral diaphysis which is unchanged. The patchy edema was suggestive of a stress reaction but no fracture was present. No other structural abnormalities were seen. Mother was told that this is a small popliteal cyst. I do not know why Leeman would be experiencing pain from a situation that is considered static except for the "stress reaction".  MRI of the lumbosacral spine showed normal disc spaces. No mention was made of the spinal cord.  A diagnosis of PDD NOS was made in August 2007 by Dr. Lyda Perone. By 1st grade however  she was doing well in school and tantrums had subsided.  She has problems with articulation which have persisted despite speech therapy.  Psychological evaluation in the spring of 2011 showed a diagnosis of ODD, possible CAPD, Full scale IQ of 101, Verbal Comprehension index: 95, Perceptual Reasoning index.: 104,Working Memory index: 113, and Processing Speed index: 91. The treatment testing on the Woodcock-Johnson III failed to show evidence of  significant learning differences. This series raiding scale suggested concerns in maintaining attention, depression and anxiety, social difficulties, bowel problems, and rule breaking behaviors at home. The processing speed was thought to significantly affect her ability to efficiently perform work in school.  Birth History 7 lbs. 10 oz infant born at full term to a 43 year old primigravida.  Mother had pneumonia 6 months into the pregnancy.  Labor lasted for over 17 hours and was induced. Mother had epidural x2.  Normal spontaneous vaginal delivery.  Nursery course was uneventful. She went home with her parents. Growth and development was reportedly normal, although there were questions about Autistic Spectrum disorder when I saw her February 19, 2006. I concluded that that was not the case.  Behavior History  none  Surgical History Procedure Laterality Date  . OSTEOCHONDROMA EXCISION    . OTHER SURGICAL HISTORY  09/2009   Patient had surgery to remove an infected salivary gland    Family History family history includes Cancer in her paternal grandfather. Family history is negative for migraines, seizures, intellectual disabilities, blindness, deafness, birth defects, chromosomal disorder, or autism.  Social History Socioeconomic History  . Marital status: Single  . Years of education:  30  . Highest education level:  Information systems manager  Occupational History  . Not employed  Tobacco Use  . Smoking status: Never Smoker  . Smokeless tobacco: Never Used  Substance and Sexual Activity  . Alcohol use: No    Alcohol/week: 0.0 standard drinks  . Drug use: No  . Sexual activity: Never  Social History Narrative    Idamae a high school graduate.    She attended [employer].    She lives with both parents and has no siblings.     She enjoys eating, dancing, and singing    She is a Paramedic at [employer].    Allergies Allergen Reactions  . No Known Allergies     Physical Exam BP 108/80   Pulse 76   Ht 5' 3.5" (1.613 m)   Wt 107 lb 6.4 oz (48.7 kg)   BMI 18.73 kg/m   General: alert, well developed, well nourished, in no acute distress, sandy hair, blue eyes, right handed Head: normocephalic, no dysmorphic features Ears, Nose and Throat: Otoscopic: tympanic membranes normal; pharynx: oropharynx is pink without exudates or tonsillar hypertrophy Neck: supple, full range of motion, no cranial or cervical bruits Respiratory: auscultation clear Cardiovascular: no murmurs, pulses are normal Musculoskeletal: no skeletal deformities or apparent scoliosis Skin: no rashes or neurocutaneous lesions  Neurologic Exam  Mental Status: alert; oriented to person, place and year; knowledge is normal for age; language is normal Cranial Nerves: visual fields are full to double simultaneous stimuli; extraocular movements are full and conjugate; pupils are round reactive to light; funduscopic examination shows sharp disc margins with normal vessels; symmetric facial strength; midline tongue and uvula; air conduction is greater than bone conduction bilaterally Motor: normal strength, tone and mass, except that she cannot fully extend her left leg despite that she has full range of motion; good fine motor movements; no pronator drift  Sensory: intact responses to cold, vibration, proprioception and stereognosis, bilaterally Coordination: good finger-to-nose, rapid repetitive alternating movements and finger apposition Gait and Station: Using a single crutch under her right arm normal gait and station; balance is adequate; I did not test it without the crutch Reflexes: symmetric and diminished but present bilaterally; no clonus; bilateral flexor plantar responses  Assessment 1.  Weakness of the left leg, R29.898.  Discussion She has made nearly complete recovery.  There is no sensory loss and the weakness that she has is mild and I think is likely  giveaway.  Plan I agree with the plan to continue therapy 2 days a week for the next 4 weeks.  I ordered it today.  She will continue to attend the [place] [facility] with a different therapist.  I asked her to return in 4 weeks.  If she has returned to baseline, I will see her at 2-month intervals.  I informed family I will be retiring as of May 31, 2021 and we will need to find an adult neurologist if she continues to require neurologic care for her tics.  Finding someone to manage her attention deficit disorder is going to be much more difficult.   Medication List   Accurate as of June 05, 2020  2:17 PM. If you have any questions, ask your nurse or doctor.    ondansetron 4 MG disintegrating tablet Commonly known as: Zofran ODT Take 1 tablet (4 mg total) by mouth every 8 (eight) hours as needed for nausea or vomiting.    The medication list was reviewed and reconciled. All changes or newly prescribed medications were explained.  A complete medication list was provided to the patient/caregiver.  Jodi Geralds MD

## 2020-07-06 ENCOUNTER — Encounter (INDEPENDENT_AMBULATORY_CARE_PROVIDER_SITE_OTHER): Payer: Self-pay | Admitting: Pediatrics

## 2020-07-06 ENCOUNTER — Telehealth (INDEPENDENT_AMBULATORY_CARE_PROVIDER_SITE_OTHER): Payer: BC Managed Care – PPO | Admitting: Pediatrics

## 2020-07-06 DIAGNOSIS — R29898 Other symptoms and signs involving the musculoskeletal system: Secondary | ICD-10-CM | POA: Diagnosis not present

## 2020-07-06 NOTE — Progress Notes (Signed)
This is a Pediatric Specialist E-Visit follow up consult provided via [employer] and their parent/guardian Eileen Rivera consented to an E-Visit consult today.  Location of patient: Eileen Rivera is at home Location of provider: Sherron Flemings is working from home Patient was referred by No ref. provider found   The following participants were involved in this E-Visit: patient, mother, CMA, provider  Chief Complain/ Reason for E-Visit today: Weakness in left leg Total time on call: 16 minutes Follow up: As needed     Patient: Eileen Rivera MRN: [id number] Sex: female DOB: 2000-10-24  Provider: Wyline Copas, MD Location of Care: [facility]  Note type: Routine return visit  History of Present Illness: Referral Source: Eileen Moll Pudlo,MD History from: mother, patient and [place] chart Chief Complaint:  Weakness in left leg  Dajai Wahlert is a 19 y.o. female who was evaluated virtually July 06, 2020 for the first time since June 05, 2020.  In the past she was followed for tics of organic origin, attention deficit disorder inattentive type, central auditory processing disorder, and dysarthria.  Her clinical course is documented in prior notes.  In brief she developed pain where she had removal of an osteochondroma from her left femur which waxed and waned since 2019.  She was unable to bear weight on her left leg except on crutches and had nonphysiologic sensory loss extending from the left inguinal ligament distally.  She had some color changes in her foot suggesting Raynaud's phenomenon in the left foot and ankle.  Laboratory studies, neuroimaging, and electrophysiologic studies were negative.  She has worked diligently at [employer] and was discharged in late October.  She stopped using crutches in mid October and is walking normally.  She still has some unusual gait.  When she walks down the stairs she tends to tilt at 45 degrees  rather than walk straight down them and she will walk a single step at a time.  This is not new.  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  She took 5 hours of courses this fall and is apparently doing well she is in the process of writing a paper with, something she has never done before. She is not experiencing any neurologic symptoms including weakness numbness tingling changes in bowel and bladder control.  She goes to bed around 11 PM and awakens around 8:30 AM.  She had no other health issues.  She is vaccinated for Covid.  Review of Systems: A complete review of systems was remarkable for patient is here to be seen for weakness of the left leg. Momreports that the patient is much improved sinceher last visit. She reports that the patient has been released from physical therapy. SHe reports that the patient is now learning to walk down the steps. She states that she is walking down the steps slowly and side ways. , all other systems reviewed and negative.  Past Medical History Diagnosis Date  . Hearing loss   . Tics of organic origin    Hospitalizations: No., Head Injury: No., Nervous System Infections: No., Immunizations up to date: Yes.    Copied from prior chart notes MRI scan on July 20 showedpatchy marrow edema along both sides of the weightbearing lateral tibiofemoral joint without evidence of fracture, and sessile osteochondromas arising from the posterior lateral distal femoral diaphysis which is unchanged. The patchy edema was suggestive of a stress reaction but no fracture was present.  No other structural abnormalities were seen. Mother was told that this is a small popliteal cyst. I do not know why Thornley would be experiencing pain from a situation that is considered static except for the "stress reaction".  MRI of the lumbosacral spine showed normal disc spaces. No mention was made of the spinal cord.  A diagnosis of PDD NOS was  made in August 2007 by Dr. Lyda Perone. By 1st grade however she was doing well in school and tantrums had subsided.  She has problems with articulation which have persisted despite speech therapy.  Psychological evaluation in the spring of 2011 showed a diagnosis of ODD, possible CAPD, Full scale IQ of 101, Verbal Comprehension index: 95, Perceptual Reasoning index.: 104,Working Memory index: 113, and Processing Speed index: 91. The treatment testing on the Woodcock-Johnson III failed to show evidence of significant learning differences. This series raiding scale suggested concerns in maintaining attention, depression and anxiety, social difficulties, bowel problems, and rule breaking behaviors at home. The processing speed was thought to significantly affect her ability to efficiently perform work in school.  Birth History 7 lbs. 10 oz infant born at full term to a 76 year old primigravida.  Mother had pneumonia 6 months into the pregnancy.  Labor lasted for over 17 hours and was induced. Mother had epidural x2.  Normal spontaneous vaginal delivery.  Nursery course was uneventful. She went home with her parents. Growth and development was reportedly normal, although there were questions about Autistic Spectrum disorder when I saw her February 19, 2006. I concluded that that was not the case.  Behavior History none  Surgical History Procedure Laterality Date  . OSTEOCHONDROMA EXCISION    . OTHER SURGICAL HISTORY  09/2009   Patient had surgery to remove an infected salivary gland    Family History family history includes Cancer in her paternal grandfather. Family history is negative for migraines, seizures, intellectual disabilities, blindness, deafness, birth defects, chromosomal disorder, or autism.  Social History Socioeconomic History  . Marital status: Single  . Years of education:  83  . Highest education level:  Information systems manager  Occupational History  . Not employed    Tobacco Use  . Smoking status: Never Smoker  . Smokeless tobacco: Never Used  Substance and Sexual Activity  . Alcohol use: No    Alcohol/week: 0.0 standard drinks  . Drug use: No  . Sexual activity: Never  Social History Narrative    Cici a high school graduate.    She attended [employer].    She lives with both parents and has no siblings.     She enjoys eating, dancing, and singing    She is a Paramedic at [employer].    Allergies Allergen Reactions  . No Known Allergies    Physical Exam There were no vitals taken for this visit.  General: alert, well developed, well nourished, in no acute distress, sandy hair, blue eyes, right handed Head: normocephalic, no dysmorphic features Neck: supple, full range of motion Musculoskeletal: no skeletal deformities or apparent scoliosis Skin: no rashes or neurocutaneous lesions  Neurologic Exam  Mental Status: alert; oriented to person, place and year; knowledge is normal for age; language is normal; speech is dysarthric but intelligible Cranial Nerves: visual fields are full to double simultaneous stimuli; extraocular movements are full and conjugate; pupils are round reactive to light; funduscopic examination shows sharp disc margins with normal vessels; symmetric facial strength; midline tongue and uvula; air conduction is greater than bone conduction bilaterally Motor:  normal functional strength, tone and mass; good fine motor movements; no pronator drift Coordination: good finger-to-nose, rapid repetitive alternating movements and finger apposition Gait and Station: normal gait and station: patient is able to walk on heels, toes and tandem without difficulty; balance is adequate; Romberg exam is negative; Gower response is negative; she was able to balance somewhat better on the right foot than the left foot but kept her right foot in the air for over 15 seconds. Reflexes: symmetric and diminished bilaterally; no  clonus; bilateral flexor plantar responses  Assessment 1.  Weakness of the left leg, resolved, R29.898.  Discussion I am very pleased that Eileen Rivera has completely recovered and will be able to return to school.  I agree that she no longer requires therapy.  Plan She will return to see me as needed.  Greater than 50% of a 16-minute visit was spent in counseling and coordination of care concerning her weakness and making recommendations for ongoing home therapy recommended by her therapist as well as returning to school full-time.  I will be happy to see her in follow-up for any issue.   Medication List   Accurate as of July 06, 2020  1:58 PM. If you have any questions, ask your nurse or doctor.    ondansetron 4 MG disintegrating tablet Commonly known as: Zofran ODT Take 1 tablet (4 mg total) by mouth every 8 (eight) hours as needed for nausea or vomiting.    The medication list was reviewed and reconciled. All changes or newly prescribed medications were explained.  A complete medication list was provided to the patient/caregiver.  Jodi Geralds MD

## 2020-07-06 NOTE — Patient Instructions (Signed)
Was a pleasure to see you today.  I am glad that you have completely recovered and I am excited that she will be able to return to school full-time.  Do not hesitate to contact me if there are any issues that arise that you think require my attention.

## 2020-08-11 IMAGING — US ULTRASOUND LEFT BREAST LIMITED
1 series · 6 of 6 positions shown · non-contrast
Comparison: Previous exam(s).

CLINICAL DATA: 18-year-old female. Follow-up for probably benign
fibroadenoma in the LEFT breast

EXAM:
ULTRASOUND OF THE LEFT BREAST

[Series 1: ultrasound left breast limited · 0.06mm/px · 6 of 6 slices shown]
[im 1/6]
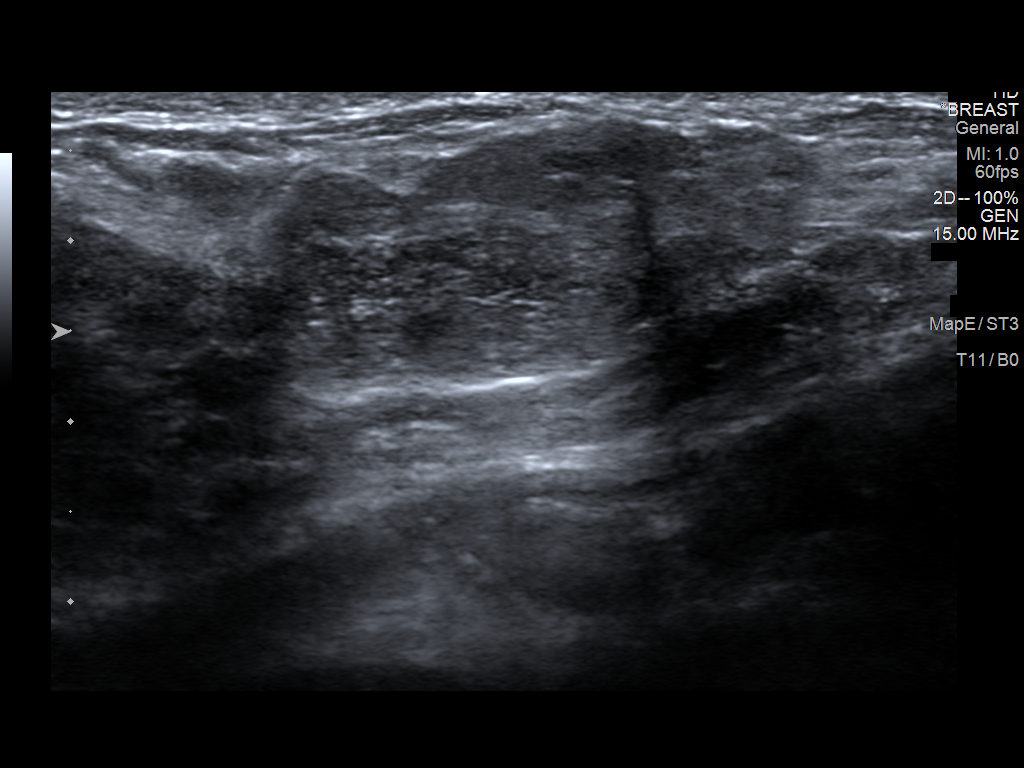
[im 2/6]
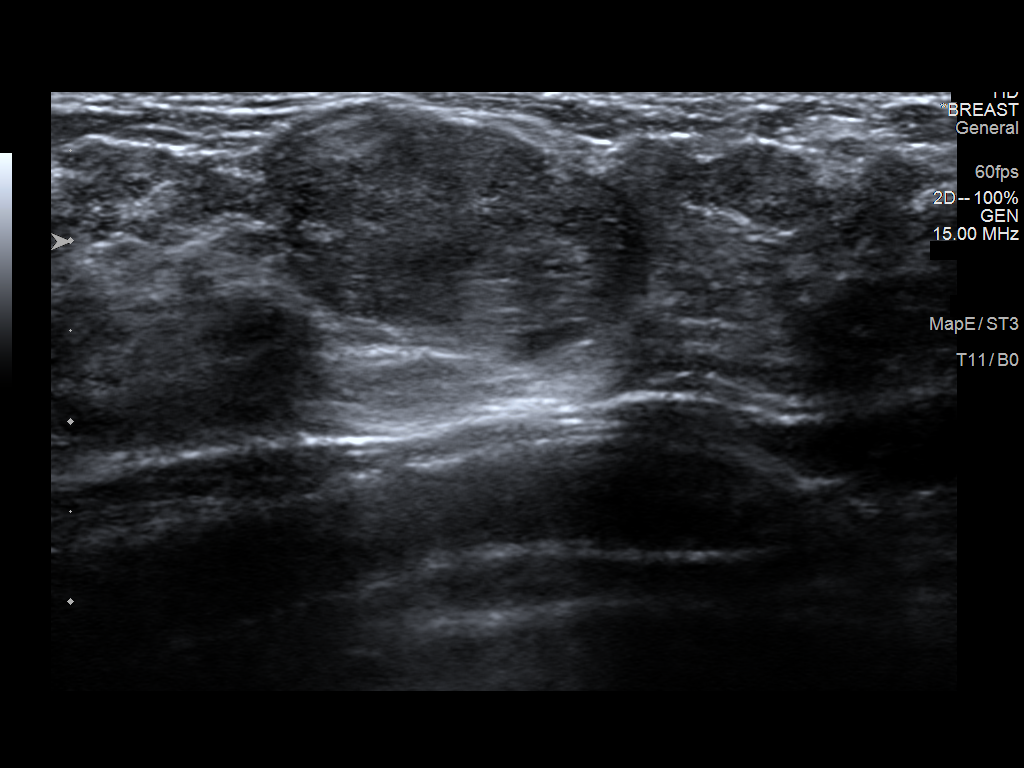
[im 3/6]
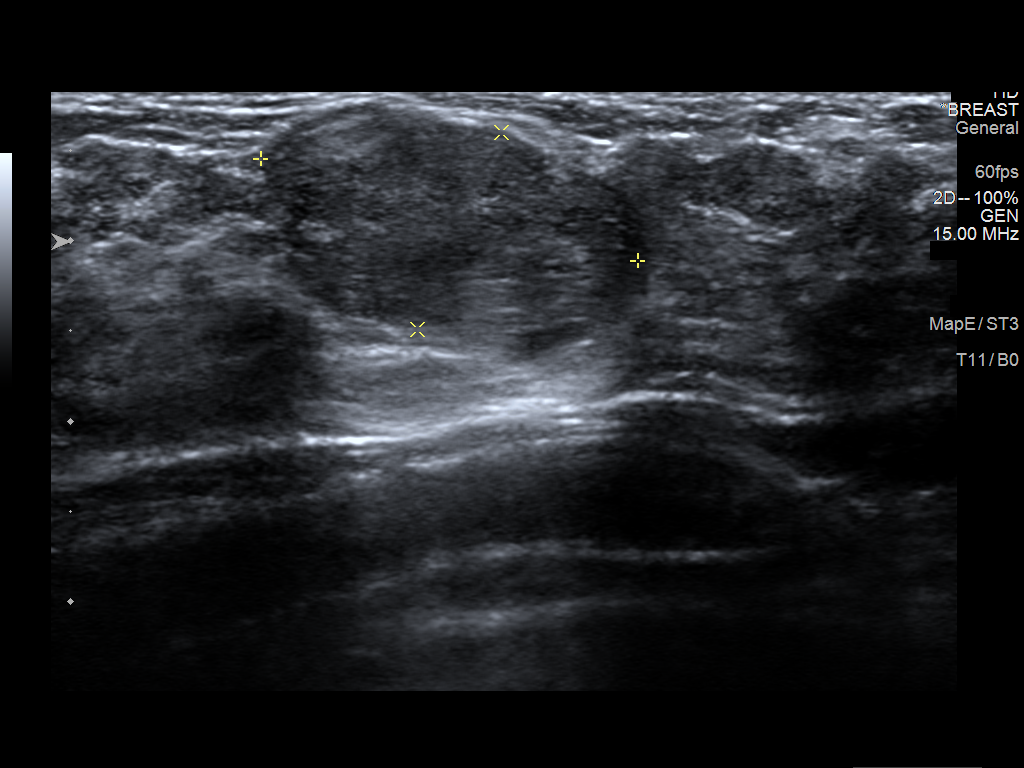
[im 4/6]
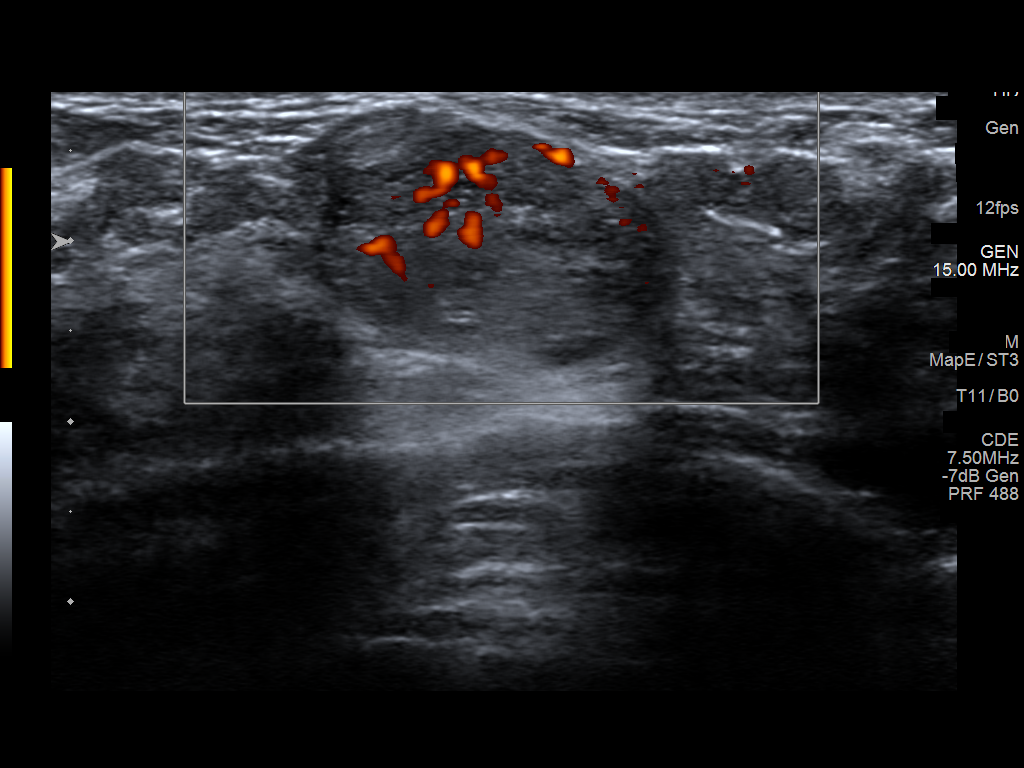
[im 5/6]
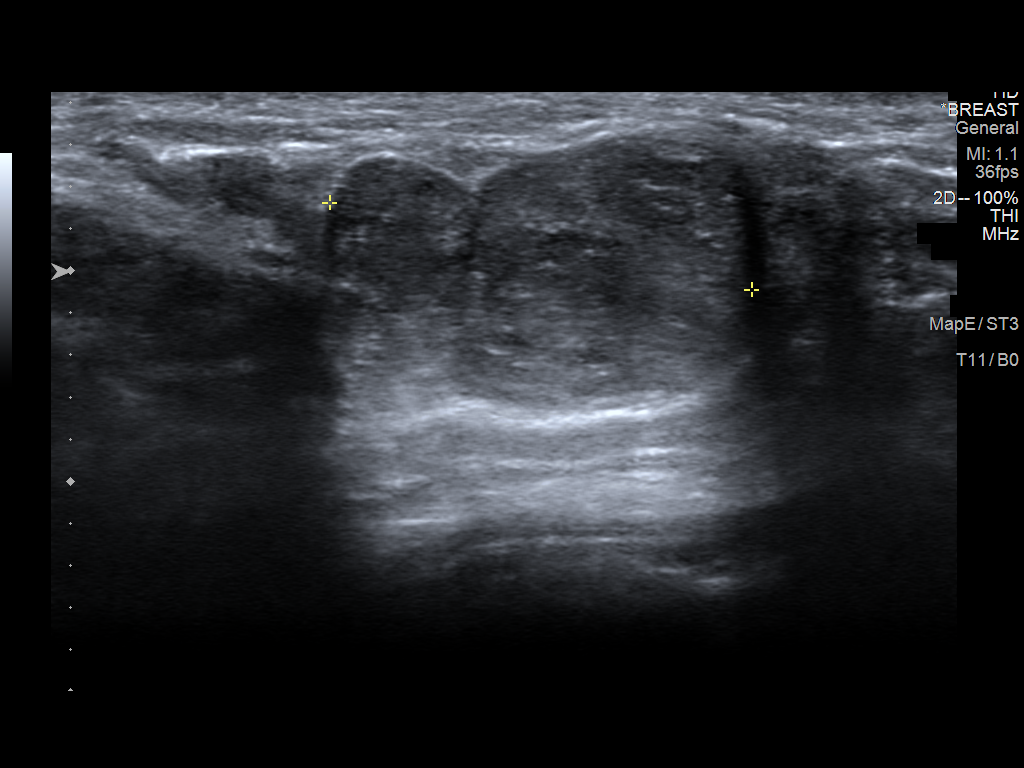
[im 6/6]
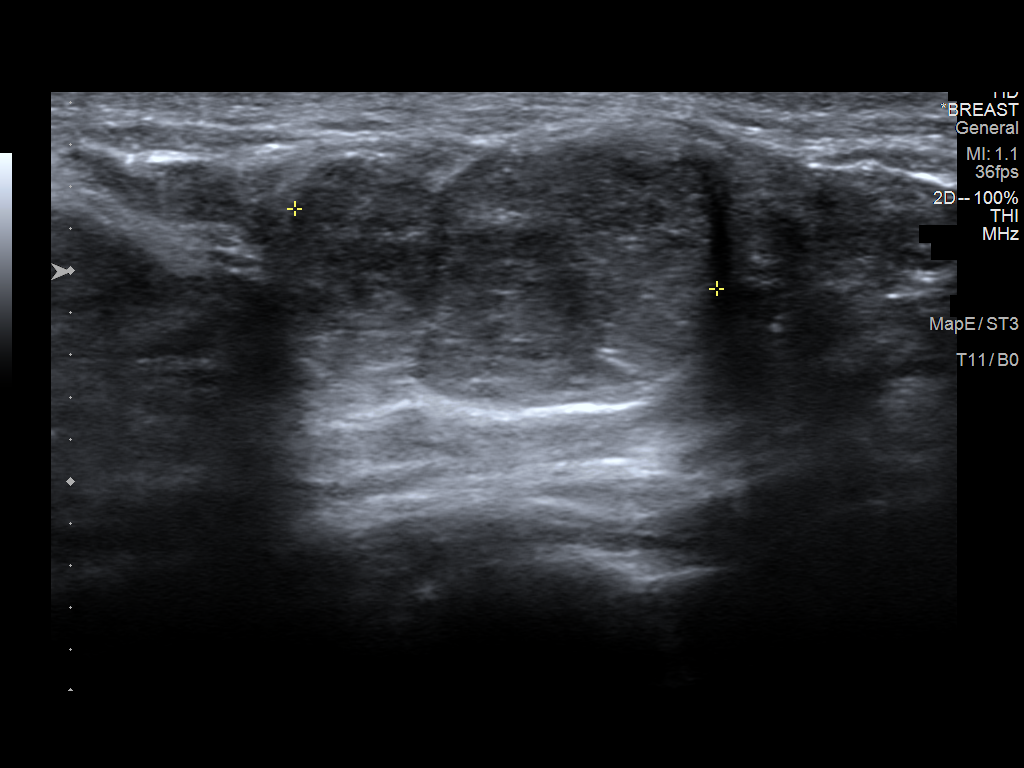

[6 of 6 positions shown; findings below may reference images not displayed]

FINDINGS: Targeted ultrasound is performed, again showing an oval
circumscribed hypoechoic mass in the LEFT breast at the 7 o'clock
axis, 6 cm from nipple, measuring 2.2 x 1.2 x 2.1 cm, not
significantly changed in the interval.
IMPRESSION: Stable probably benign fibroadenoma within the LEFT breast at the 7
o'clock axis, 6 cm from the nipple, measuring 2.2 x 1.2 x 2.1 cm.
Recommend additional follow-up ultrasound in 6 months to ensure
continued stability.

RECOMMENDATION:
LEFT breast ultrasound in 6 months.

I have discussed the findings and recommendations with the patient.
Results were also provided in writing at the conclusion of the
visit. If applicable, a reminder letter will be sent to the patient
regarding the next appointment.

BI-RADS CATEGORY  3: Probably benign.

## 2021-01-03 ENCOUNTER — Encounter (INDEPENDENT_AMBULATORY_CARE_PROVIDER_SITE_OTHER): Payer: Self-pay

## 2021-02-14 IMAGING — US US BREAST*L* LIMITED INC AXILLA
1 series · 6 of 6 positions shown · non-contrast
Comparison: Previous exam(s).

CLINICAL DATA: Patient for short-term follow-up probably benign
left breast mass.

EXAM:
ULTRASOUND OF THE LEFT BREAST

[Series 1: us breast*left* limited inc axilla · 0.06mm/px · 6 of 6 slices shown]
[im 1/6]
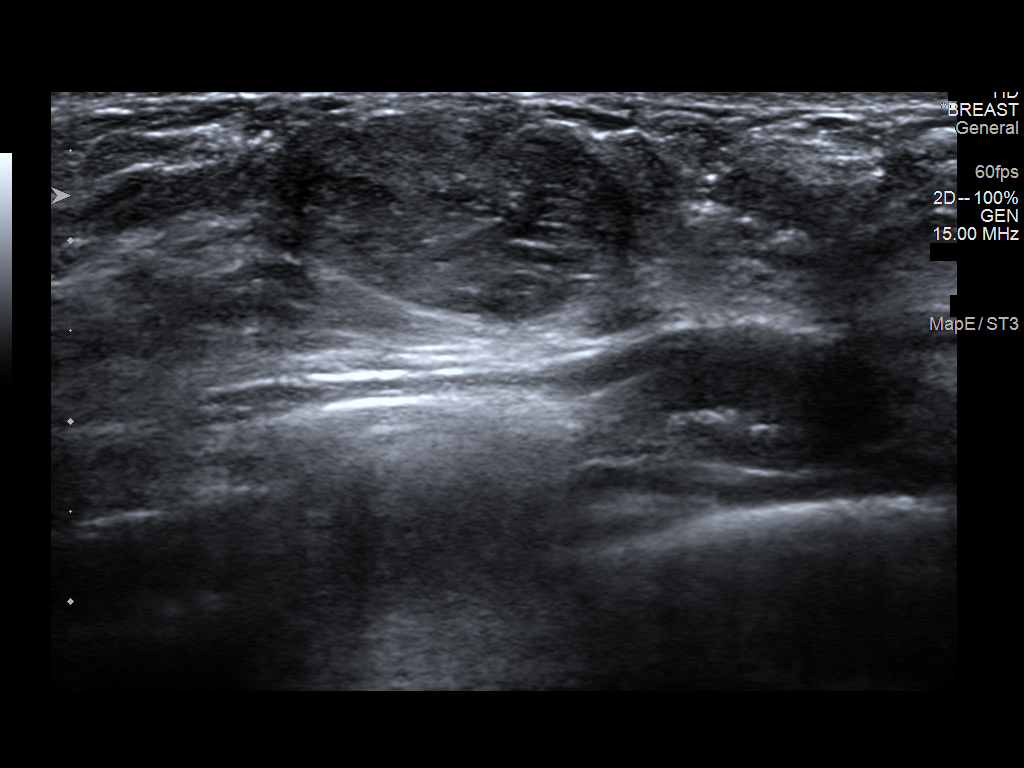
[im 2/6]
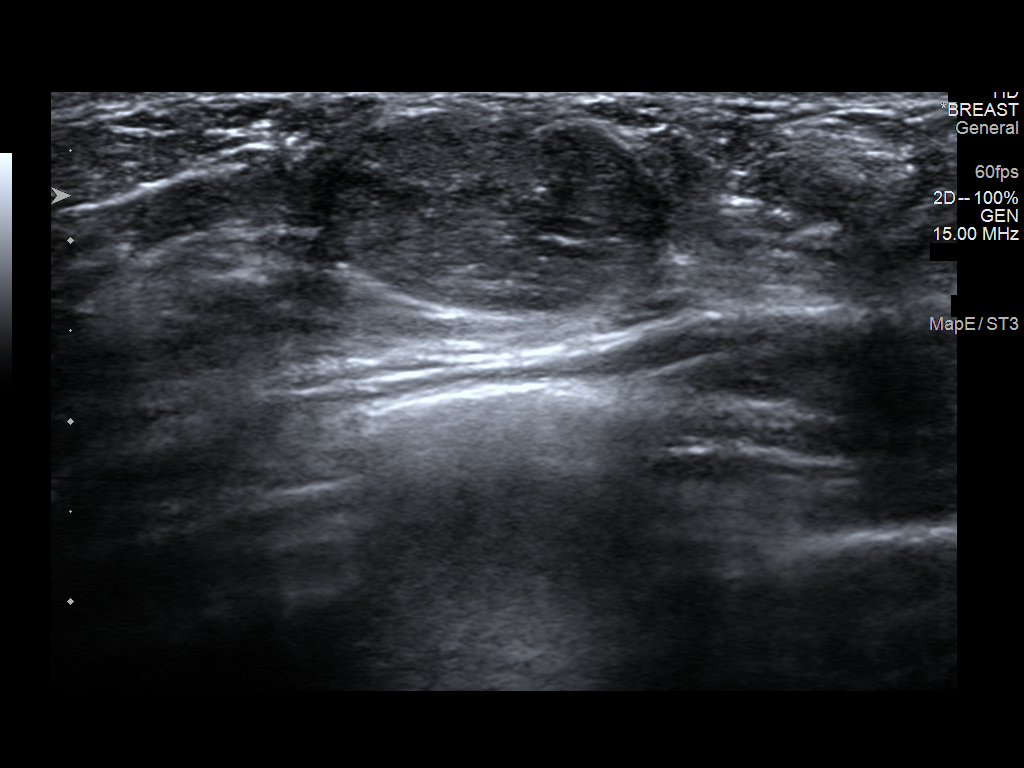
[im 3/6]
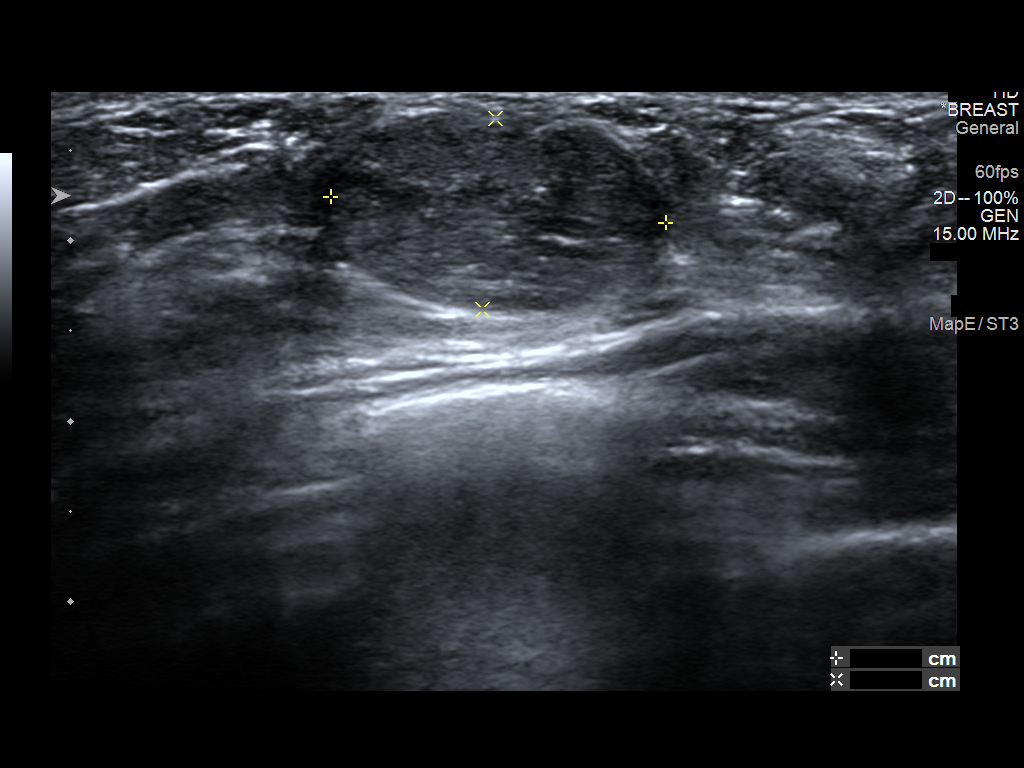
[im 4/6]
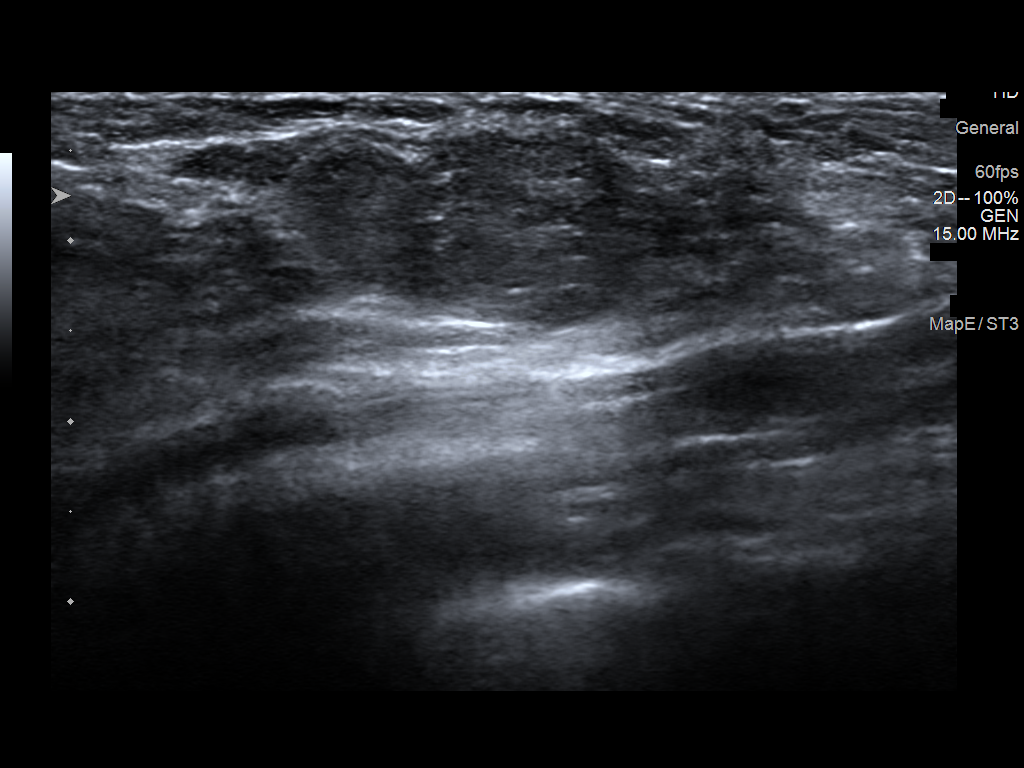
[im 5/6]
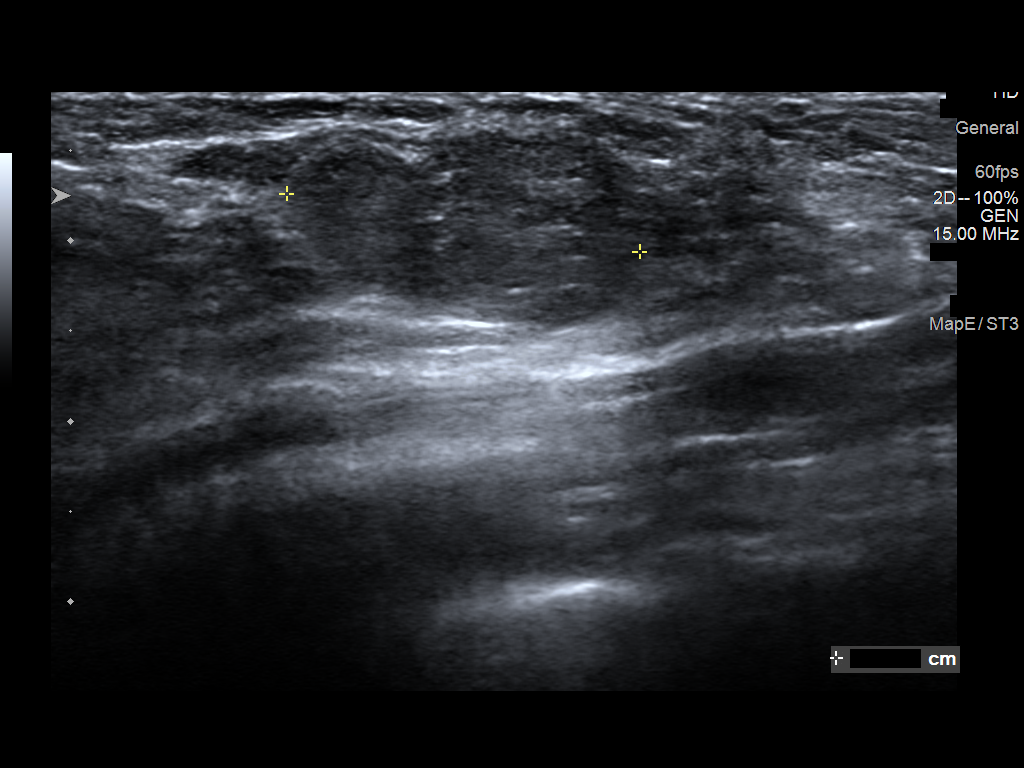
[im 6/6]
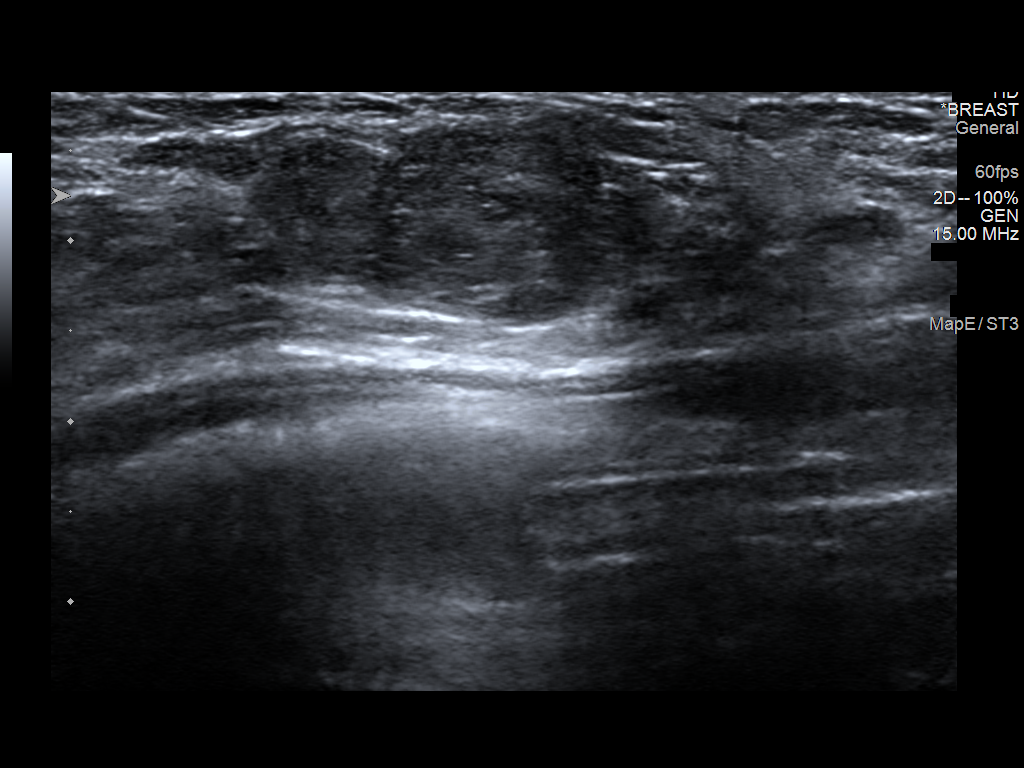

[6 of 6 positions shown; findings below may reference images not displayed]

FINDINGS: Targeted ultrasound is performed, showing a stable 1.9 x 1.0 x
cm oval circumscribed hypoechoic mass left breast 7 o'clock position
6 cm from nipple.
IMPRESSION: Stable probably benign left breast mass, favored to represent a
fibroadenoma.

RECOMMENDATION:
Left breast diagnostic ultrasound in 12 months to demonstrate 2
years of stability.

I have discussed the findings and recommendations with the patient.
If applicable, a reminder letter will be sent to the patient
regarding the next appointment.

BI-RADS CATEGORY  3: Probably benign.

## 2023-11-26 ENCOUNTER — Other Ambulatory Visit: Payer: Self-pay | Admitting: Family

## 2023-11-26 DIAGNOSIS — N6320 Unspecified lump in the left breast, unspecified quadrant: Secondary | ICD-10-CM

## 2023-11-30 ENCOUNTER — Ambulatory Visit
Admission: RE | Admit: 2023-11-30 | Discharge: 2023-11-30 | Disposition: A | Discharge status: Home / Self Care | Source: Ambulatory Visit | Attending: Family | Admitting: Family

## 2023-11-30 DIAGNOSIS — N6320 Unspecified lump in the left breast, unspecified quadrant: Secondary | ICD-10-CM

## 2024-12-09 ENCOUNTER — Ambulatory Visit: Admitting: Neurology
